# Patient Record
Sex: Male | Born: 1955 | Race: White | Hispanic: No | Marital: Single | Smoking: Never smoker
Health system: Southern US, Community
[De-identification: ages and names within clinical notes are randomized; demographics above are authoritative.]

## PROBLEM LIST (undated history)

## (undated) DIAGNOSIS — E785 Hyperlipidemia, unspecified: Secondary | ICD-10-CM

## (undated) DIAGNOSIS — F419 Anxiety disorder, unspecified: Secondary | ICD-10-CM

## (undated) HISTORY — PX: HERNIA REPAIR: SHX51

---

## 2016-11-02 ENCOUNTER — Emergency Department (HOSPITAL_BASED_OUTPATIENT_CLINIC_OR_DEPARTMENT_OTHER)
Admission: EM | Admit: 2016-11-02 | Discharge: 2016-11-02 | Disposition: A | Discharge status: Home / Self Care | Payer: BLUE CROSS/BLUE SHIELD | Attending: Emergency Medicine | Admitting: Emergency Medicine

## 2016-11-02 ENCOUNTER — Encounter (HOSPITAL_BASED_OUTPATIENT_CLINIC_OR_DEPARTMENT_OTHER): Payer: Self-pay | Admitting: *Deleted

## 2016-11-02 ENCOUNTER — Emergency Department (HOSPITAL_BASED_OUTPATIENT_CLINIC_OR_DEPARTMENT_OTHER): Payer: BLUE CROSS/BLUE SHIELD

## 2016-11-02 DIAGNOSIS — W1830XA Fall on same level, unspecified, initial encounter: Secondary | ICD-10-CM | POA: Insufficient documentation

## 2016-11-02 DIAGNOSIS — S4991XA Unspecified injury of right shoulder and upper arm, initial encounter: Secondary | ICD-10-CM | POA: Diagnosis present

## 2016-11-02 DIAGNOSIS — Z79899 Other long term (current) drug therapy: Secondary | ICD-10-CM | POA: Insufficient documentation

## 2016-11-02 DIAGNOSIS — Y939 Activity, unspecified: Secondary | ICD-10-CM | POA: Insufficient documentation

## 2016-11-02 DIAGNOSIS — Y929 Unspecified place or not applicable: Secondary | ICD-10-CM | POA: Diagnosis not present

## 2016-11-02 DIAGNOSIS — Y999 Unspecified external cause status: Secondary | ICD-10-CM | POA: Diagnosis not present

## 2016-11-02 DIAGNOSIS — S43101A Unspecified dislocation of right acromioclavicular joint, initial encounter: Secondary | ICD-10-CM | POA: Diagnosis not present

## 2016-11-02 HISTORY — DX: Hyperlipidemia, unspecified: E78.5

## 2016-11-02 HISTORY — DX: Anxiety disorder, unspecified: F41.9

## 2016-11-02 MED ORDER — FENTANYL CITRATE (PF) 100 MCG/2ML IJ SOLN
50.0000 ug | INTRAMUSCULAR | Status: DC | PRN
  Administered 2016-11-02: 50 ug via INTRAVENOUS
  Filled 2016-11-02: qty 2

## 2016-11-02 NOTE — ED Triage Notes (Signed)
Pt c/o fall with right shoulder injury x 2 hrs ago, sent here from [facility] for eval ? dislocation

## 2016-11-02 NOTE — Discharge Instructions (Signed)
Please read attached information regarding her condition. Take ibuprofen, Tylenol or other pain medication as needed. Follow-up with orthopedics for further evaluation. Return to ED for worsening pain, additional injury, temperature color change of joint.

## 2016-11-02 NOTE — ED Provider Notes (Signed)
[facility] Provider Note   CSN: [id number] Arrival date & time: 11/02/16  1320     History   Chief Complaint Chief Complaint  Patient presents with  . Shoulder Injury    HPI Burnard BuntingDavid Foglio is a 61 y.o. male.  HPI Patient presents to ED for evaluation of right shoulder pain that occurred after fall. He states that he fell and landed on his right shoulder. He was sent here from urgent care for evaluation of possible dislocation. He reports pain worse with lifting arm straight upwards. He has not had any medication for pain. Denies any previous fracture, dislocation or injury in this area in the past. He denies any head injury or loss of consciousness. Reports no changes with gait.  Past Medical History:  Diagnosis Date  . Anxiety   . Hyperlipidemia     There are no active problems to display for this patient.   Past Surgical History:  Procedure Laterality Date  . HERNIA REPAIR         Home Medications    Prior to Admission medications   Medication Sig Start Date End Date Taking? Authorizing Provider  cyclobenzaprine (FLEXERIL) 10 MG tablet Take 10 mg by mouth 3 (three) times daily as needed for muscle spasms.   Yes [provider]  escitalopram (LEXAPRO) 10 MG tablet Take 10 mg by mouth daily.   Yes [provider]  pravastatin (PRAVACHOL) 40 MG tablet Take 40 mg by mouth daily.   Yes [provider]    Family History No family history on file.  Social History Social History  Substance Use Topics  . Smoking status: Never Smoker  . Smokeless tobacco: Not on file  . Alcohol use No     Allergies   Patient has no known allergies.   Review of Systems Review of Systems  Constitutional: Negative for appetite change, chills and fever.  HENT: Negative for ear pain, rhinorrhea, sneezing and sore throat.   Eyes: Negative for photophobia and visual disturbance.  Respiratory: Negative for cough, chest tightness, shortness of  breath and wheezing.   Cardiovascular: Negative for chest pain and palpitations.  Gastrointestinal: Negative for abdominal pain, blood in stool, constipation, diarrhea, nausea and vomiting.  Genitourinary: Negative for dysuria, hematuria and urgency.  Musculoskeletal: Positive for arthralgias. Negative for myalgias.  Skin: Negative for rash.  Neurological: Negative for dizziness, weakness and light-headedness.     Physical Exam Updated Vital Signs BP 122/86 (BP Location: Right Arm)   Pulse 63   Resp 18   Ht 5\' 10"  (1.778 m)   Wt 70.3 kg (155 lb)   SpO2 100%   BMI 22.24 kg/m   Physical Exam  Constitutional: He appears well-developed and well-nourished. No distress.  HENT:  Head: Normocephalic and atraumatic.  Nose: Nose normal.  Eyes: Conjunctivae and EOM are normal. Left eye exhibits no discharge. No scleral icterus.  Neck: Normal range of motion. Neck supple.  Cardiovascular: Normal rate, regular rhythm, normal heart sounds and intact distal pulses.  Exam reveals no gallop and no friction rub.   No murmur heard. Pulmonary/Chest: Effort normal and breath sounds normal. No respiratory distress.  Abdominal: Soft. Bowel sounds are normal. He exhibits no distension. There is no tenderness. There is no guarding.  Musculoskeletal: Normal range of motion. He exhibits tenderness. He exhibits no edema.       Arms: Tenderness to palpation at the indicated area of the right shoulder. There is a deformity noted with abduction of the  arm. No color or temperature change noted. Area appears neurovascularly intact.  Neurological: He is alert. He exhibits normal muscle tone. Coordination normal.  Skin: Skin is warm and dry. No rash noted.  Psychiatric: He has a normal mood and affect.  Nursing note and vitals reviewed.    ED Treatments / Results  Labs (all labs ordered are listed, but only abnormal results are displayed) Labs Reviewed - No data to display  EKG  EKG  Interpretation None       Radiology Dg Shoulder Right  Result Date: 11/02/2016 CLINICAL DATA:  Fall. EXAM: RIGHT SHOULDER - 2+ VIEW COMPARISON:  No recent prior . FINDINGS: No acute bony or joint abnormality identified. No evidence of fracture dislocation. IMPRESSION: No acute abnormality. Electronically Signed   By: Maisie Fushomas  Register   On: 11/02/2016 13:50   Dg Ac Joints  Result Date: 11/02/2016 CLINICAL DATA:  Right shoulder pain after fall EXAM: LEFT ACROMIOCLAVICULAR JOINTS COMPARISON:  None. FINDINGS: Widening of the right acromioclavicular joint. Increased right coracoclavicular distance. These findings are compatible with a type 3 AC joint separation. No fracture is seen. Visualized lungs are clear. IMPRESSION: Suspected right type 3 AC joint separation, as above. Electronically Signed   By: Charline BillsSriyesh  Krishnan M.D.   On: 11/02/2016 14:42    Procedures Procedures (including critical care time)  Medications Ordered in ED Medications  fentaNYL (SUBLIMAZE) injection 50 mcg (50 mcg Intravenous Given 11/02/16 1350)     Initial Impression / Assessment and Plan / ED Course  I have reviewed the triage vital signs and the nursing notes.  Pertinent labs & imaging results that were available during my care of the patient were reviewed by me and considered in my medical decision making (see chart for details).     Patient presents to ED for evaluation of shoulder injury that occurred after fall. He states pain worse with moving arm straight up. He denies any previous fracture, dislocation or procedure done in the area. On physical exam there is a deformity noted with abduction of the arm. There is no color or temperature change noted which would concern me for septic joint. Area appears neurovascularly intact. Initial x-ray of the shoulder was negative. X-ray of the clavicle showed grade 3 before meals joint separation. Up-to-date literature states that initial management is nonoperative with  rest, ice, immobilization with sling and pain medication. We'll give patient a sling and advised to apply ice to the area as tolerated. Encouraged patient to follow up with orthopedics for further evaluation and management. Patient reports he has pain medication at home for a back strain that he experienced last week. Patient appears stable for discharge at this time. Strict return precautions given.  Patient discussed with and seen by Dr. Karma GanjaLinker.  Final Clinical Impressions(s) / ED Diagnoses   Final diagnoses:  AC separation, right, initial encounter    New Prescriptions New Prescriptions   No medications on file     Dietrich PatesKhatri, Virgle Arth, PA-C 11/02/16 1507    Mabe, Latanya MaudlinMartha L, MD 11/02/16 1530

## 2016-11-02 NOTE — ED Notes (Signed)
ED Provider at bedside. 

## 2016-11-02 NOTE — ED Notes (Signed)
Pt. returned from XR. 

## 2018-08-03 IMAGING — DX DG SHOULDER 2+V*R*
3 series · 4 of 4 positions shown · non-contrast
Comparison: No recent prior .

CLINICAL DATA: Fall.

EXAM:
RIGHT SHOULDER - 2+ VIEW

[shoulder grashey]
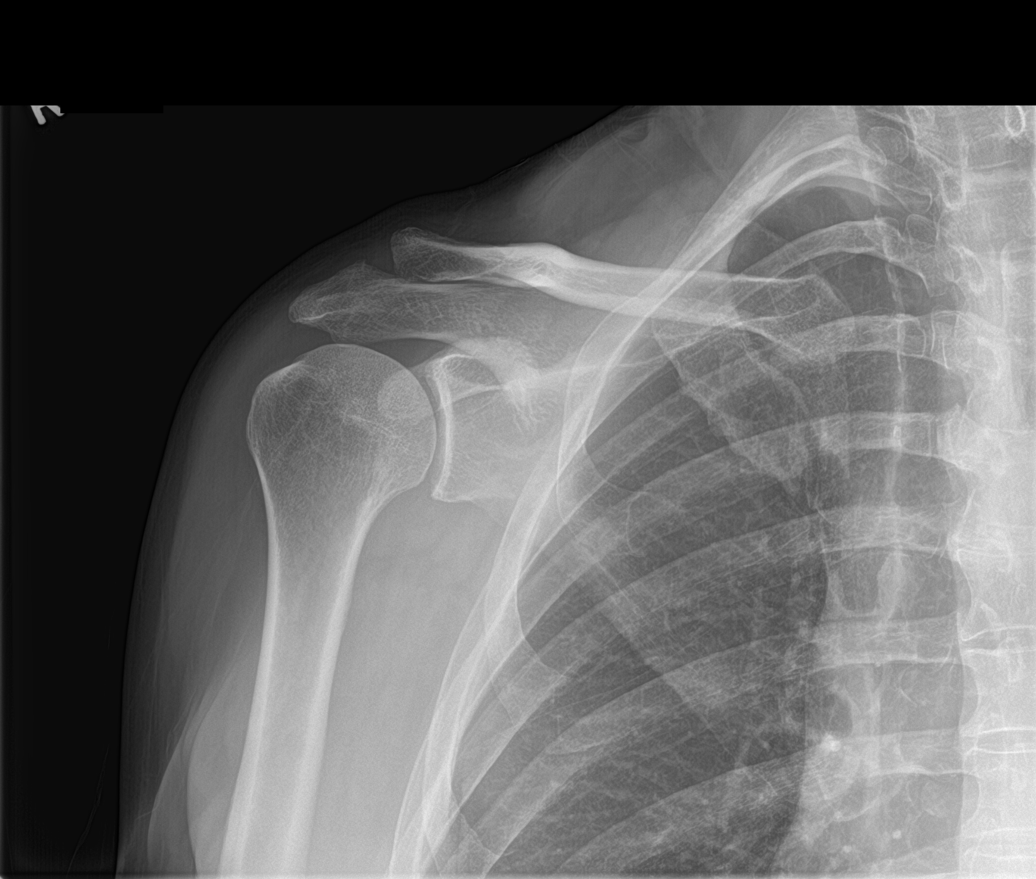

[shoulder y view]
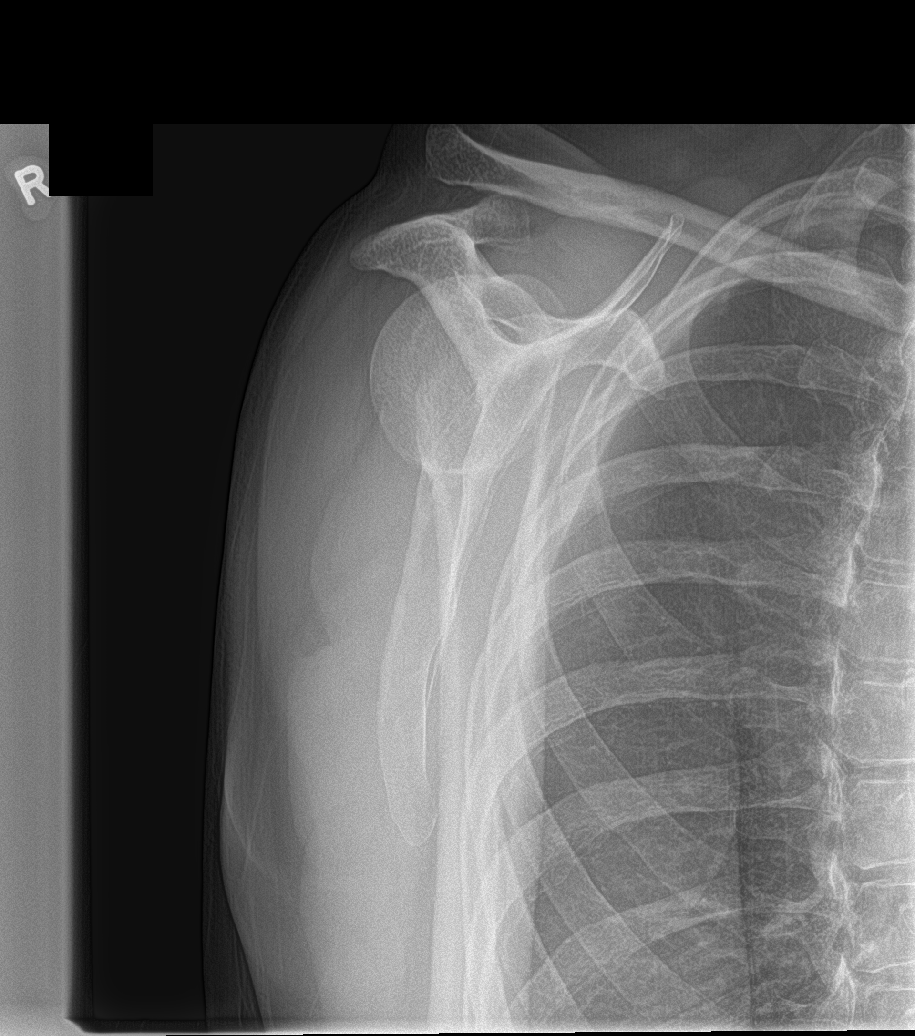

[Series 3: shoulder axillary · 0.14mm/px · 2 of 2 slices shown]
[im 1/2]
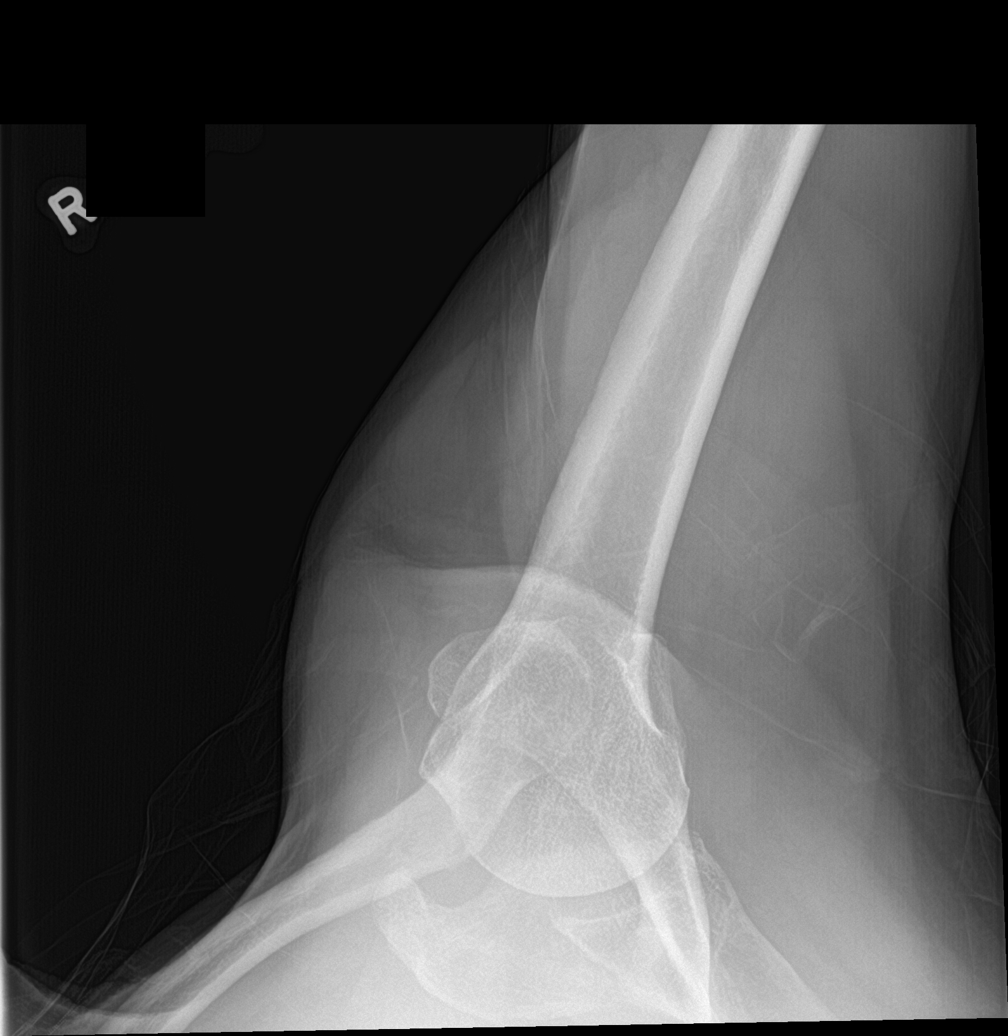
[im 2/2]
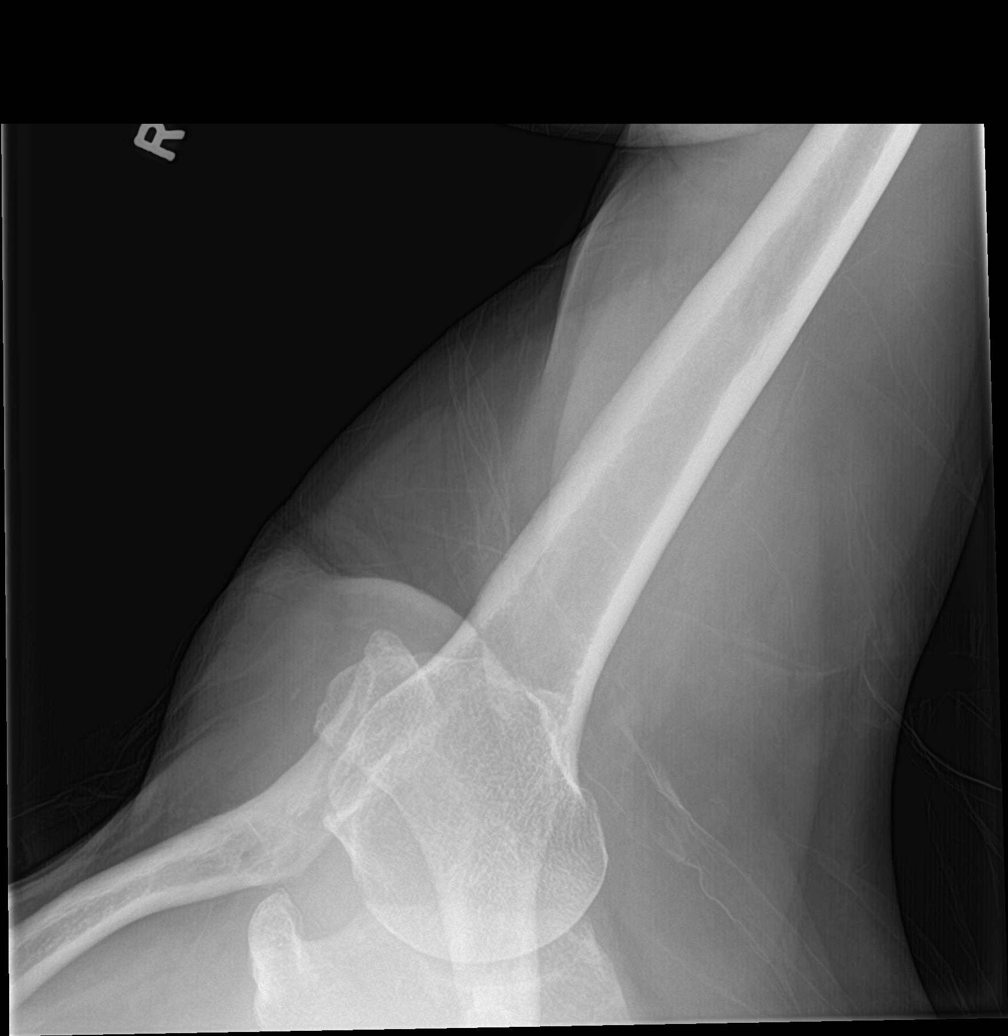

[4 of 4 positions shown; findings below may reference images not displayed]

FINDINGS: No acute bony or joint abnormality identified. No evidence of
fracture dislocation.
IMPRESSION: No acute abnormality.

## 2018-08-03 IMAGING — DX DG AC JOINTS*L*
2 series · 2 of 2 positions shown · non-contrast
Comparison: None.

CLINICAL DATA: Right shoulder pain after fall

EXAM:
LEFT ACROMIOCLAVICULAR JOINTS

[ac joints w/ weights]
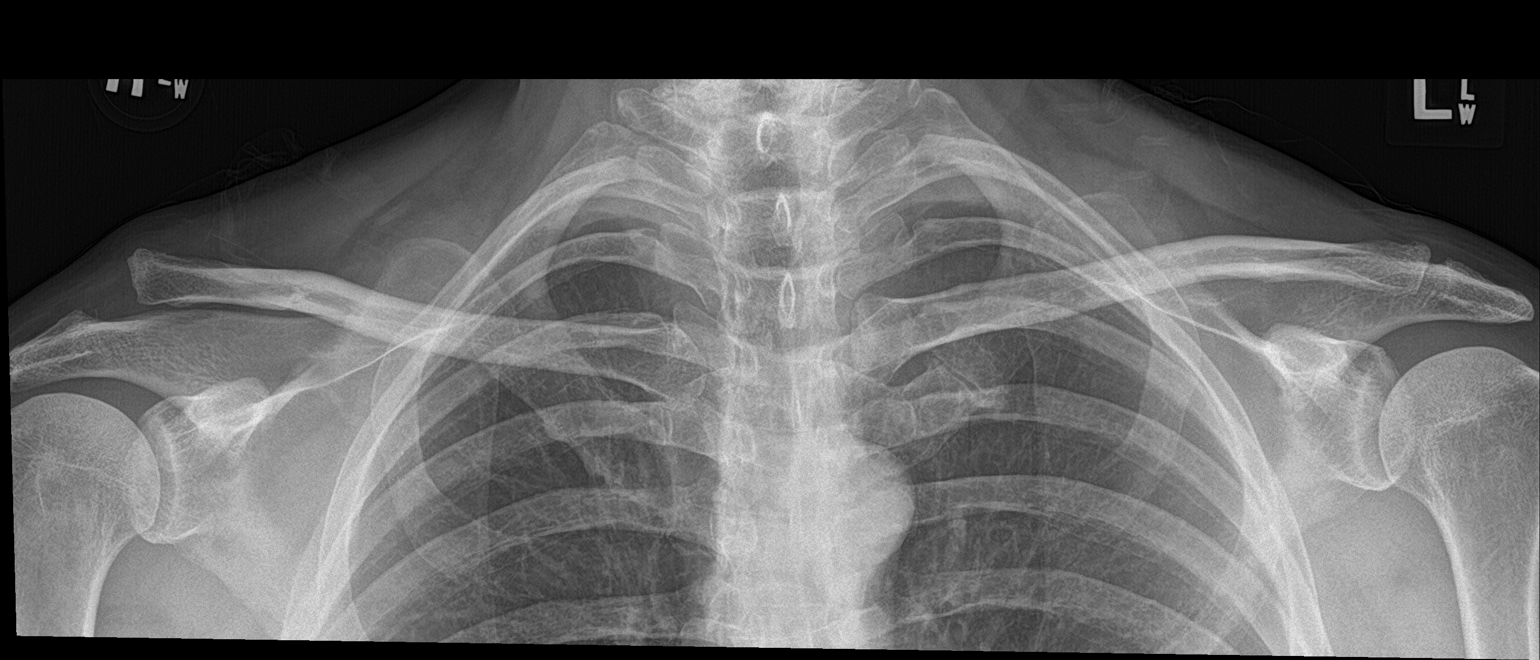

[ac joints w/out weights]
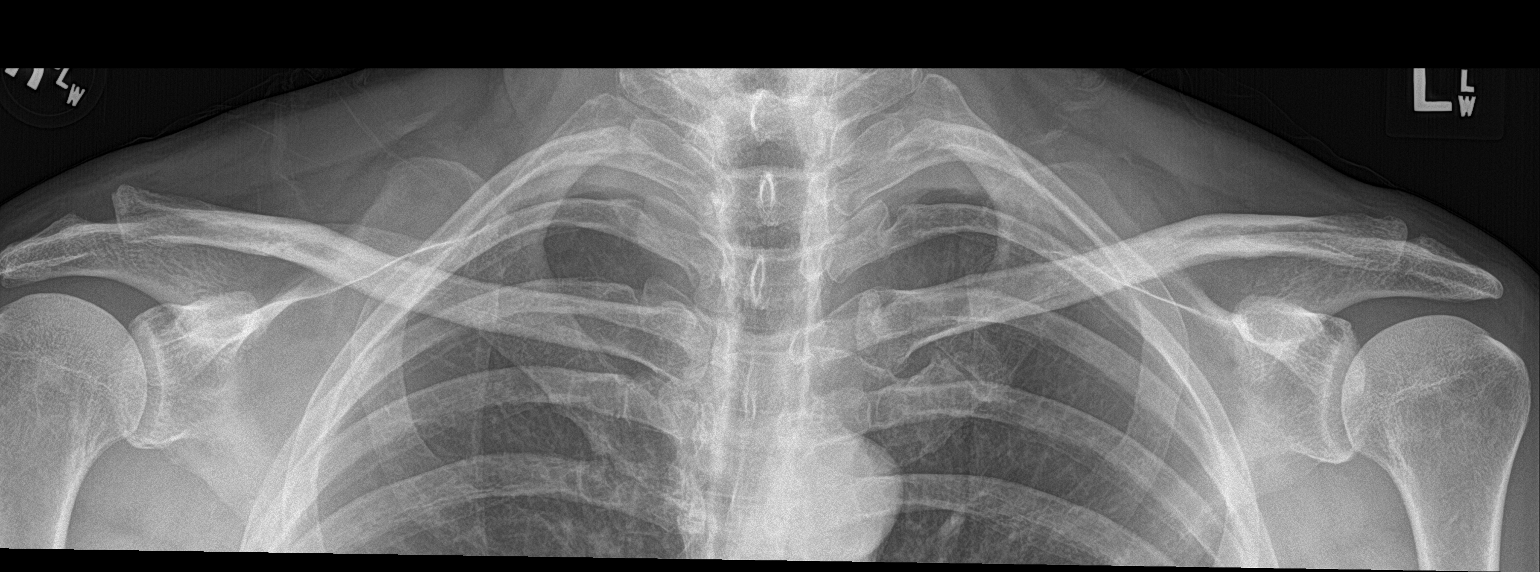

[2 of 2 positions shown; findings below may reference images not displayed]

FINDINGS: Widening of the right acromioclavicular joint. Increased right
coracoclavicular distance. These findings are compatible with a type
3 AC joint separation.

No fracture is seen.

Visualized lungs are clear.
IMPRESSION: Suspected right type 3 AC joint separation, as above.
